# Patient Record
Sex: Female | Born: 1965 | Race: Black or African American | Hispanic: No | Marital: Single | Smoking: Never smoker
Health system: Southern US, Community
[De-identification: ages and names within clinical notes are randomized; demographics above are authoritative.]

## PROBLEM LIST (undated history)

## (undated) DIAGNOSIS — I10 Essential (primary) hypertension: Secondary | ICD-10-CM

## (undated) HISTORY — PX: ABDOMINAL HYSTERECTOMY: SHX81

---

## 2012-07-07 ENCOUNTER — Other Ambulatory Visit: Payer: Self-pay | Admitting: Obstetrics and Gynecology

## 2012-07-07 DIAGNOSIS — N920 Excessive and frequent menstruation with regular cycle: Secondary | ICD-10-CM

## 2012-07-07 DIAGNOSIS — D259 Leiomyoma of uterus, unspecified: Secondary | ICD-10-CM

## 2012-07-07 DIAGNOSIS — D500 Iron deficiency anemia secondary to blood loss (chronic): Secondary | ICD-10-CM

## 2012-07-09 ENCOUNTER — Other Ambulatory Visit: Payer: Self-pay | Admitting: Obstetrics and Gynecology

## 2012-07-09 DIAGNOSIS — D500 Iron deficiency anemia secondary to blood loss (chronic): Secondary | ICD-10-CM

## 2012-07-09 DIAGNOSIS — N920 Excessive and frequent menstruation with regular cycle: Secondary | ICD-10-CM

## 2012-07-09 DIAGNOSIS — D259 Leiomyoma of uterus, unspecified: Secondary | ICD-10-CM

## 2012-07-16 ENCOUNTER — Ambulatory Visit
Admission: RE | Admit: 2012-07-16 | Discharge: 2012-07-16 | Disposition: A | Discharge status: Home / Self Care | Payer: BC Managed Care – PPO | Source: Ambulatory Visit | Attending: Obstetrics and Gynecology | Admitting: Obstetrics and Gynecology

## 2012-07-16 ENCOUNTER — Telehealth: Payer: Self-pay | Admitting: Emergency Medicine

## 2012-07-16 ENCOUNTER — Other Ambulatory Visit: Payer: Self-pay | Admitting: Emergency Medicine

## 2012-07-16 DIAGNOSIS — I10 Essential (primary) hypertension: Secondary | ICD-10-CM

## 2012-07-16 DIAGNOSIS — D500 Iron deficiency anemia secondary to blood loss (chronic): Secondary | ICD-10-CM

## 2012-07-16 DIAGNOSIS — D259 Leiomyoma of uterus, unspecified: Secondary | ICD-10-CM

## 2012-07-16 DIAGNOSIS — N920 Excessive and frequent menstruation with regular cycle: Secondary | ICD-10-CM

## 2012-07-16 LAB — BUN: BUN: 7 mg/dL (ref 6–23)

## 2012-07-16 NOTE — Telephone Encounter (Signed)
LMOVM FOR JOY AT DR Donzetta Matters OFFICE TO SET PT UP FOR EBX AND THAT PT MRI PELVIS IS SCHEDULED FOR 07-22-12 AT 700AM AND TO CK ON PRECERT

## 2012-07-22 ENCOUNTER — Ambulatory Visit
Admission: RE | Admit: 2012-07-22 | Discharge: 2012-07-22 | Disposition: A | Discharge status: Home / Self Care | Payer: BC Managed Care – PPO | Source: Ambulatory Visit | Attending: Obstetrics and Gynecology | Admitting: Obstetrics and Gynecology

## 2012-07-22 DIAGNOSIS — D259 Leiomyoma of uterus, unspecified: Secondary | ICD-10-CM

## 2012-07-22 DIAGNOSIS — N920 Excessive and frequent menstruation with regular cycle: Secondary | ICD-10-CM

## 2012-07-22 DIAGNOSIS — D500 Iron deficiency anemia secondary to blood loss (chronic): Secondary | ICD-10-CM

## 2012-07-22 MED ORDER — GADOBENATE DIMEGLUMINE 529 MG/ML IV SOLN
20.0000 mL | Freq: Once | INTRAVENOUS | Status: AC | PRN
  Administered 2012-07-22: 20 mL via INTRAVENOUS

## 2012-07-23 ENCOUNTER — Telehealth: Payer: Self-pay | Admitting: Interventional Radiology

## 2012-09-08 ENCOUNTER — Telehealth: Payer: Self-pay | Admitting: Emergency Medicine

## 2012-09-08 NOTE — Telephone Encounter (Signed)
Message copied by Jari Sportsman on Tue Sep 08, 2012  2:19 PM ------      Message from: [place], [place]      Created: Wed Aug 19, 2012  9:24 AM      Regarding: FW: EBX RESULTS       COLPOSCOPY- R/S FOR 09-03-12 PER JOY W/ OFFICE            ----- Message -----         From: Jari Sportsman, EMT         Sent: 07/23/2012   5:22 PM           To: Jari Sportsman, EMT, Henri Ann Maki, RN      Subject: EBX RESULTS                                              PT IS HAVING ANOTHER EBX ON 08-19-12- GET RESULTS AND THEN PROCEED WITH [place] IF NEG.       ------

## 2012-09-08 NOTE — Telephone Encounter (Signed)
LM FOR JOY TO CK ON PATH RESULTS   JOY WILL FAX TO [place] WHEN SHE RECEIVES THEM.

## 2012-09-14 ENCOUNTER — Telehealth: Payer: Self-pay | Admitting: Interventional Radiology

## 2012-12-09 ENCOUNTER — Telehealth: Payer: Self-pay | Admitting: Emergency Medicine

## 2012-12-09 NOTE — Telephone Encounter (Signed)
SEE BELOW.

## 2012-12-09 NOTE — Telephone Encounter (Signed)
Message copied by Jari Sportsman on Wed Dec 09, 2012 11:42 AM ------      Message from: Simonne Come      Created: Mon Sep 14, 2012  3:01 PM      Regarding: RE: EBX RESULTS       Left a message with the patient to call clinic -> hopefully you can get in touch with me when she calls, otherwise if you could get a number where she can be reached.            I wanted to ask her if she'd just like to proceed with the UFE versus see me again in clinic to review her images -> I'd prefer to see her in clinic again as her many issue is adenomyosis and NOT fibroid, but I'd still be willing to try the UFE if she completely opposed to a hysterectomy.            Thanks,      Vonna Kotyk      ----- Message -----         From: Jari Sportsman, EMT         Sent: 09/14/2012   9:12 AM           To: Simonne Come, MD      Subject: RE: EBX RESULTS                                          OK JUST LET ME KNOW!            THANKS,      T      ----- Message -----         From: Simonne Come, MD         Sent: 09/11/2012   4:41 PM           To: Jari Sportsman, EMT      Subject: RE: EBX RESULTS                                          I will need to call patient again and see if she wants to proceed or see me in clinic again to review images.            Thanks,      Vonna Kotyk      ----- Message -----         From: Jari Sportsman, EMT         Sent: 09/11/2012  10:48 AM           To: Simonne Come, MD      Subject: FW: EBX RESULTS                                          I RECEIVED THE ENDOCERVIX CURETTINGS BX RESULTS AND SCANNED IN EPIC-- PLEASE ADVISE ON [place].            TAMS            ----- Message -----         From: Jari Sportsman, EMT         Sent: 08/19/2012   9:24 AM  To: Jari Sportsman, EMT, Henri Ann Maki, RN      Subject: FW: EBX RESULTS                                          COLPOSCOPY- R/S FOR 09-03-12 PER JOY W/ OFFICE            ----- Message -----         From: Jari Sportsman, EMT         Sent: 07/23/2012    5:22 PM           To: Jari Sportsman, EMT, Henri Ann Maki, RN      Subject: EBX RESULTS                                              PT IS HAVING ANOTHER EBX ON 08-19-12- GET RESULTS AND THEN PROCEED WITH [place] IF NEG.                               ------

## 2014-11-16 ENCOUNTER — Emergency Department (HOSPITAL_COMMUNITY): Payer: 59

## 2014-11-16 ENCOUNTER — Encounter (HOSPITAL_COMMUNITY): Payer: Self-pay | Admitting: *Deleted

## 2014-11-16 ENCOUNTER — Emergency Department (HOSPITAL_COMMUNITY)
Admission: EM | Admit: 2014-11-16 | Discharge: 2014-11-16 | Disposition: A | Discharge status: Home / Self Care | Payer: 59 | Attending: Emergency Medicine | Admitting: Emergency Medicine

## 2014-11-16 DIAGNOSIS — I10 Essential (primary) hypertension: Secondary | ICD-10-CM | POA: Diagnosis not present

## 2014-11-16 DIAGNOSIS — R42 Dizziness and giddiness: Secondary | ICD-10-CM

## 2014-11-16 DIAGNOSIS — E860 Dehydration: Secondary | ICD-10-CM | POA: Insufficient documentation

## 2014-11-16 DIAGNOSIS — I9589 Other hypotension: Secondary | ICD-10-CM | POA: Diagnosis not present

## 2014-11-16 HISTORY — DX: Essential (primary) hypertension: I10

## 2014-11-16 LAB — CBC WITH DIFFERENTIAL/PLATELET
BASOS PCT: 0 %
Basophils Absolute: 0 10*3/uL (ref 0.0–0.1)
Eosinophils Absolute: 0.3 10*3/uL (ref 0.0–0.7)
Eosinophils Relative: 3 %
HEMATOCRIT: 34.6 % — AB (ref 36.0–46.0)
HEMOGLOBIN: 11 g/dL — AB (ref 12.0–15.0)
LYMPHS ABS: 3.2 10*3/uL (ref 0.7–4.0)
Lymphocytes Relative: 34 %
MCH: 27.6 pg (ref 26.0–34.0)
MCHC: 31.8 g/dL (ref 30.0–36.0)
MCV: 86.7 fL (ref 78.0–100.0)
MONOS PCT: 6 %
Monocytes Absolute: 0.6 10*3/uL (ref 0.1–1.0)
NEUTROS ABS: 5.3 10*3/uL (ref 1.7–7.7)
NEUTROS PCT: 57 %
Platelets: 262 10*3/uL (ref 150–400)
RBC: 3.99 MIL/uL (ref 3.87–5.11)
RDW: 14 % (ref 11.5–15.5)
WBC: 9.3 10*3/uL (ref 4.0–10.5)

## 2014-11-16 LAB — BASIC METABOLIC PANEL
Anion gap: 8 (ref 5–15)
BUN: 17 mg/dL (ref 6–20)
CHLORIDE: 103 mmol/L (ref 101–111)
CO2: 25 mmol/L (ref 22–32)
CREATININE: 1.3 mg/dL — AB (ref 0.44–1.00)
Calcium: 9.1 mg/dL (ref 8.9–10.3)
GFR calc non Af Amer: 47 mL/min — ABNORMAL LOW (ref >60)
GFR, EST AFRICAN AMERICAN: 54 mL/min — AB (ref >60)
Glucose, Bld: 189 mg/dL — ABNORMAL HIGH (ref 65–99)
POTASSIUM: 3.3 mmol/L — AB (ref 3.5–5.1)
Sodium: 136 mmol/L (ref 135–145)

## 2014-11-16 LAB — URINALYSIS, ROUTINE W REFLEX MICROSCOPIC
BILIRUBIN URINE: NEGATIVE
Glucose, UA: NEGATIVE mg/dL
Hgb urine dipstick: NEGATIVE
LEUKOCYTES UA: NEGATIVE
NITRITE: NEGATIVE
PH: 6.5 (ref 5.0–8.0)
PROTEIN: NEGATIVE mg/dL
Specific Gravity, Urine: 1.02 (ref 1.005–1.030)
UROBILINOGEN UA: 4 mg/dL — AB (ref 0.0–1.0)

## 2014-11-16 LAB — ETHANOL

## 2014-11-16 LAB — D-DIMER, QUANTITATIVE (NOT AT ARMC)

## 2014-11-16 LAB — TROPONIN I

## 2014-11-16 MED ORDER — SODIUM CHLORIDE 0.9 % IV BOLUS (SEPSIS)
1000.0000 mL | Freq: Once | INTRAVENOUS | Status: AC
  Administered 2014-11-16: 1000 mL via INTRAVENOUS

## 2014-11-16 NOTE — ED Notes (Signed)
Pt refused to do orthostatic vitals.

## 2014-11-16 NOTE — ED Notes (Signed)
Pt c/o dizziness and headache that started x 45 minutes ago; pt is c/o left sided chest pain

## 2014-11-16 NOTE — Discharge Instructions (Signed)
You were seen today for dizziness. Your dizziness is likely a combination of low blood pressure and dehydration. Given that you're just started on a blood pressure medication in the last several weeks, that may be causing your blood pressure to be too low. You also have evidence of dehydration which can worsen symptoms of low blood pressure including dizziness. You should stop taking her blood pressure medications and follow-up in one day for blood pressure recheck with her primary physician.  Dizziness Dizziness is a common problem. It is a feeling of unsteadiness or light-headedness. You may feel like you are about to faint. Dizziness can lead to injury if you stumble or fall. A person of any age group can suffer from dizziness, but dizziness is more common in older adults. CAUSES  Dizziness can be caused by many different things, including:  Middle ear problems.  Standing for too long.  Infections.  An allergic reaction.  Aging.  An emotional response to something, such as the sight of blood.  Side effects of medicines.  Tiredness.  Problems with circulation or blood pressure.  Excessive use of alcohol or medicines, or illegal drug use.  Breathing too fast (hyperventilation).  An irregular heart rhythm (arrhythmia).  A low red blood cell count (anemia).  Pregnancy.  Vomiting, diarrhea, fever, or other illnesses that cause body fluid loss (dehydration).  Diseases or conditions such as Parkinson's disease, high blood pressure (hypertension), diabetes, and thyroid problems.  Exposure to extreme heat. DIAGNOSIS  Your health care provider will ask about your symptoms, perform a physical exam, and perform an electrocardiogram (ECG) to record the electrical activity of your heart. Your health care provider may also perform other heart or blood tests to determine the cause of your dizziness. These may include:  Transthoracic echocardiogram (TTE). During echocardiography, sound  waves are used to evaluate how blood flows through your heart.  Transesophageal echocardiogram (TEE).  Cardiac monitoring. This allows your health care provider to monitor your heart rate and rhythm in real time.  Holter monitor. This is a portable device that records your heartbeat and can help diagnose heart arrhythmias. It allows your health care provider to track your heart activity for several days if needed.  Stress tests by exercise or by giving medicine that makes the heart beat faster. TREATMENT  Treatment of dizziness depends on the cause of your symptoms and can vary greatly. HOME CARE INSTRUCTIONS   Drink enough fluids to keep your urine clear or pale yellow. This is especially important in very hot weather. In older adults, it is also important in cold weather.  Take your medicine exactly as directed if your dizziness is caused by medicines. When taking blood pressure medicines, it is especially important to get up slowly.  Rise slowly from chairs and steady yourself until you feel okay.  In the morning, first sit up on the side of the bed. When you feel okay, stand slowly while holding onto something until you know your balance is fine.  Move your legs often if you need to stand in one place for a long time. Tighten and relax your muscles in your legs while standing.  Have someone stay with you for 1-2 days if dizziness continues to be a problem. Do this until you feel you are well enough to stay alone. Have the person call your health care provider if he or she notices changes in you that are concerning.  Do not drive or use heavy machinery if you feel dizzy.  Do not drink alcohol. SEEK IMMEDIATE MEDICAL CARE IF:   Your dizziness or light-headedness gets worse.  You feel nauseous or vomit.  You have problems talking, walking, or using your arms, hands, or legs.  You feel weak.  You are not thinking clearly or you have trouble forming sentences. It may take a  friend or family member to notice this.  You have chest pain, abdominal pain, shortness of breath, or sweating.  Your vision changes.  You notice any bleeding.  You have side effects from medicine that seems to be getting worse rather than better. MAKE SURE YOU:   Understand these instructions.  Will watch your condition.  Will get help right away if you are not doing well or get worse. Document Released: 08/07/2000 Document Revised: 02/16/2013 Document Reviewed: 08/31/2010 [facility] Patient Information 2015 [place], [place]. This information is not intended to replace advice given to you by your health care provider. Make sure you discuss any questions you have with your health care provider.  Dehydration, Adult Dehydration is when you lose more fluids from the body than you take in. Vital organs like the kidneys, brain, and heart cannot function without a proper amount of fluids and salt. Any loss of fluids from the body can cause dehydration.  CAUSES   Vomiting.  Diarrhea.  Excessive sweating.  Excessive urine output.  Fever. SYMPTOMS  Mild dehydration  Thirst.  Dry lips.  Slightly dry mouth. Moderate dehydration  Very dry mouth.  Sunken eyes.  Skin does not bounce back quickly when lightly pinched and released.  Dark urine and decreased urine production.  Decreased tear production.  Headache. Severe dehydration  Very dry mouth.  Extreme thirst.  Rapid, weak pulse (more than 100 beats per minute at rest).  Cold hands and feet.  Not able to sweat in spite of heat and temperature.  Rapid breathing.  Blue lips.  Confusion and lethargy.  Difficulty being awakened.  Minimal urine production.  No tears. DIAGNOSIS  Your caregiver will diagnose dehydration based on your symptoms and your exam. Blood and urine tests will help confirm the diagnosis. The diagnostic evaluation should also identify the cause of dehydration. TREATMENT  Treatment of  mild or moderate dehydration can often be done at home by increasing the amount of fluids that you drink. It is best to drink small amounts of fluid more often. Drinking too much at one time can make vomiting worse. Refer to the home care instructions below. Severe dehydration needs to be treated at the hospital where you will probably be given intravenous (IV) fluids that contain water and electrolytes. HOME CARE INSTRUCTIONS   Ask your caregiver about specific rehydration instructions.  Drink enough fluids to keep your urine clear or pale yellow.  Drink small amounts frequently if you have nausea and vomiting.  Eat as you normally do.  Avoid:  Foods or drinks high in sugar.  Carbonated drinks.  Juice.  Extremely hot or cold fluids.  Drinks with caffeine.  Fatty, greasy foods.  Alcohol.  Tobacco.  Overeating.  Gelatin desserts.  Wash your hands well to avoid spreading bacteria and viruses.  Only take over-the-counter or prescription medicines for pain, discomfort, or fever as directed by your caregiver.  Ask your caregiver if you should continue all prescribed and over-the-counter medicines.  Keep all follow-up appointments with your caregiver. SEEK MEDICAL CARE IF:  You have abdominal pain and it increases or stays in one area (localizes).  You have a rash, stiff neck, or  severe headache.  You are irritable, sleepy, or difficult to awaken.  You are weak, dizzy, or extremely thirsty. SEEK IMMEDIATE MEDICAL CARE IF:   You are unable to keep fluids down or you get worse despite treatment.  You have frequent episodes of vomiting or diarrhea.  You have blood or green matter (bile) in your vomit.  You have blood in your stool or your stool looks black and tarry.  You have not urinated in 6 to 8 hours, or you have only urinated a small amount of very dark urine.  You have a fever.  You faint. MAKE SURE YOU:   Understand these instructions.  Will watch  your condition.  Will get help right away if you are not doing well or get worse. Document Released: 02/11/2005 Document Revised: 05/06/2011 Document Reviewed: 10/01/2010 [facility] Patient Information 2015 [place], [place]. This information is not intended to replace advice given to you by your health care provider. Make sure you discuss any questions you have with your health care provider.

## 2014-11-16 NOTE — ED Provider Notes (Signed)
CSN: [id number]     Arrival date & time 11/16/14  0014 History   This chart was scribed for Merryl Hacker, MD by Randa Evens, ED Scribe. This patient was seen in room APA09/APA09 and the patient's care was started at 12:23 AM.      Chief Complaint  Patient presents with  . Dizziness   The history is provided by the patient. No language interpreter was used.   HPI Comments: Taylor Franklin is a 49 y.o. female who presents to the Emergency Department complaining of dizziness onset PTA. PT reports some weakness, HA and resolved chest tightness. Pt states that her symptoms began soon after waking from a short nap. She states that her symptoms are worse when trying to stand. Family member states that she may have lost consciousness. Daughter states that she hears a "thud" and when she went to check on her she was leaning on the wall trying not to fall over.  multiple attempts to ambulate the patient at home failed secondary to dizziness. Patient describes feeling off balance and not room spinning. Patient also reports headache. Denies worst headache of her life. States she feels generally weak but no focal weakness or numbness. No vision changes. Denies fever, cough, congestion, n/v/d.    Patient was recently started on blood pressure medication one month ago. This was changed to an arm with HCTZ secondary to intolerance to an ACE inhibitor. This change occurred approximately 2 weeks ago.   Past Medical History  Diagnosis Date  . Hypertension    Past Surgical History  Procedure Laterality Date  . Abdominal hysterectomy    . Cesarean section      x3   History reviewed. No pertinent family history. Social History  Substance Use Topics  . Smoking status: Never Smoker   . Smokeless tobacco: Never Used  . Alcohol Use: No   OB History    No data available     Review of Systems  Constitutional: Negative for fever.  Eyes: Negative for visual disturbance.  Respiratory: Negative for  cough, chest tightness and shortness of breath.   Cardiovascular: Positive for chest pain. Negative for leg swelling.  Gastrointestinal: Negative for nausea, vomiting and abdominal pain.  Genitourinary: Negative for dysuria.  Musculoskeletal: Negative for back pain.  Neurological: Positive for dizziness, weakness, light-headedness and headaches.  Psychiatric/Behavioral: Negative for confusion.  All other systems reviewed and are negative.     Allergies  Lisinopril  Home Medications   Prior to Admission medications   Medication Sig Start Date End Date Taking? Authorizing Provider  tiZANidine (ZANAFLEX) 4 MG tablet Take 4 mg by mouth every 6 (six) hours as needed for muscle spasms.   Yes Historical Provider, MD   BP 93/64 mmHg  Pulse 57  Temp(Src) 98 F (36.7 C) (Oral)  Resp 10  Ht 5\' 6"  (1.676 m)  Wt 188 lb (85.276 kg)  BMI 30.36 kg/m2  SpO2 100%   Physical Exam  Constitutional: She is oriented to person, place, and time. No distress.  HENT:  Head: Normocephalic and atraumatic.  Eyes: EOM are normal. Pupils are equal, round, and reactive to light.  Neck: Neck supple.  Cardiovascular: Normal rate, regular rhythm and normal heart sounds.   No murmur heard. Pulmonary/Chest: Effort normal and breath sounds normal. No respiratory distress. She has no wheezes.  Abdominal: Soft. Bowel sounds are normal. There is no tenderness. There is no rebound and no guarding.  Neurological: She is alert and oriented to person, place,  and time.  Patient very slow with following commands, no facial asymmetry, cranial nerves II through XII intact, patient is alert and oriented, 5 out of 5 strength bilateral upper and lower extremities, no dysmetria to finger-nose-finger  Skin: Skin is warm and dry.  Psychiatric: She has a normal mood and affect.  Nursing note and vitals reviewed.   ED Course  Procedures (including critical care time) DIAGNOSTIC STUDIES:   COORDINATION OF CARE: 12:42  AM-Discussed treatment plan with pt at bedside and pt agreed to plan.     Labs Review Labs Reviewed  CBC WITH DIFFERENTIAL/PLATELET - Abnormal; Notable for the following:    Hemoglobin 11.0 (*)    HCT 34.6 (*)    All other components within normal limits  BASIC METABOLIC PANEL - Abnormal; Notable for the following:    Potassium 3.3 (*)    Glucose, Bld 189 (*)    Creatinine, Ser 1.30 (*)    GFR calc non Af Amer 47 (*)    GFR calc Af Amer 54 (*)    All other components within normal limits  URINALYSIS, ROUTINE W REFLEX MICROSCOPIC (NOT AT [facility]) - Abnormal; Notable for the following:    Ketones, ur TRACE (*)    Urobilinogen, UA 4.0 (*)    All other components within normal limits  TROPONIN I  D-DIMER, QUANTITATIVE (NOT AT [facility])  ETHANOL    Imaging Review Ct Head Wo Contrast  11/16/2014   CLINICAL DATA:  49 year old female with chest pain and weakness  EXAM: CT HEAD WITHOUT CONTRAST  TECHNIQUE: Contiguous axial images were obtained from the base of the skull through the vertex without intravenous contrast.  COMPARISON:  None.  FINDINGS: The ventricles and the sulci are appropriate in size for the patient's age. There is no intracranial hemorrhage. No midline shift or mass effect identified. The gray-white matter differentiation is preserved.  There is mild mucoperiosteal thickening of paranasal sinuses with partial opacification of the ethmoid air cells. The visualized mastoid air cells are well aerated. The calvarium is intact.  IMPRESSION: No acute intracranial pathology.   Electronically Signed   By: Anner Crete M.D.   On: 11/16/2014 01:56   Dg Chest Portable 1 View  11/16/2014   CLINICAL DATA:  Central chest pain and dizziness tonight, history hypertension  EXAM: PORTABLE CHEST ONE VIEW:  COMPARISON:  Portable exam 0050 hr compared to 03/11/2010  FINDINGS: Normal heart size, mediastinal contours, and pulmonary vascularity.  Lungs clear.  No pleural effusion pneumothorax.  Bones  unremarkable.  IMPRESSION: No acute abnormalities.   Electronically Signed   By: Lavonia Dana M.D.   On: 11/16/2014 01:44      EKG Interpretation   Date/Time:  Wednesday November 16 2014 00:27:58 EDT Ventricular Rate:  78 PR Interval:  135 QRS Duration: 79 QT Interval:  401 QTC Calculation: 457 R Axis:   44 Text Interpretation:  Sinus rhythm Confirmed by HORTON  MD, COURTNEY  ([id number]) on 11/16/2014 1:34:08 AM      MDM   Final diagnoses:  Dizziness  Dehydration  Other specified hypotension    Patient presents with dizziness and near syncope. Nonfocal on exam. Vital signs notable for initial blood pressure of 90/54. Patient has recently started blood pressure medications. She describes dizziness with position and upon standing. Patient given fluids. She has a nonfocal exam. No signs of dysmetria.  Workup notable for creatinine of 1.3. Unknown baseline. Patient may be mildly dehydrated. She does have trace ketones in her urine. D-dimer  is negative troponin is reassuring. EKG is reassuring.  CT head obtained given headache and near syncope and is reassuring. After 2 L of fluid, patient ambulated and reports that she feels much better. She does continue to endorse some dizziness. Blood pressures have been consistently 90s over 50s. Third liter of fluids ordered.  4:10 AM On recheck, patient reports improvement of symptoms. She states she is ready to go home. Blood pressure now 93/64. Feel patient symptoms may be related to dehydration in addition to new onset of blood pressure medications. She was advised to discontinue her blood-pressure medication and follow-up in the next day with her primary doctor for recheck. She was able to ambulate without difficulty.  After history, exam, and medical workup I feel the patient has been appropriately medically screened and is safe for discharge home. Pertinent diagnoses were discussed with the patient. Patient was given return precautions.  I  personally performed the services described in this documentation, which was scribed in my presence. The recorded information has been reviewed and is accurate.      Merryl Hacker, MD 11/16/14 [phone]

## 2016-01-15 NOTE — Telephone Encounter (Signed)
Wrong encounter. 

## 2016-02-24 IMAGING — CR DG CHEST 1V PORT
1 series · 1 of 1 positions shown · non-contrast
Comparison: Portable exam 2212 hr compared to 03/11/2010

CLINICAL DATA: Central chest pain and dizziness tonight, history
hypertension

EXAM:
PORTABLE CHEST ONE VIEW:

[ap]
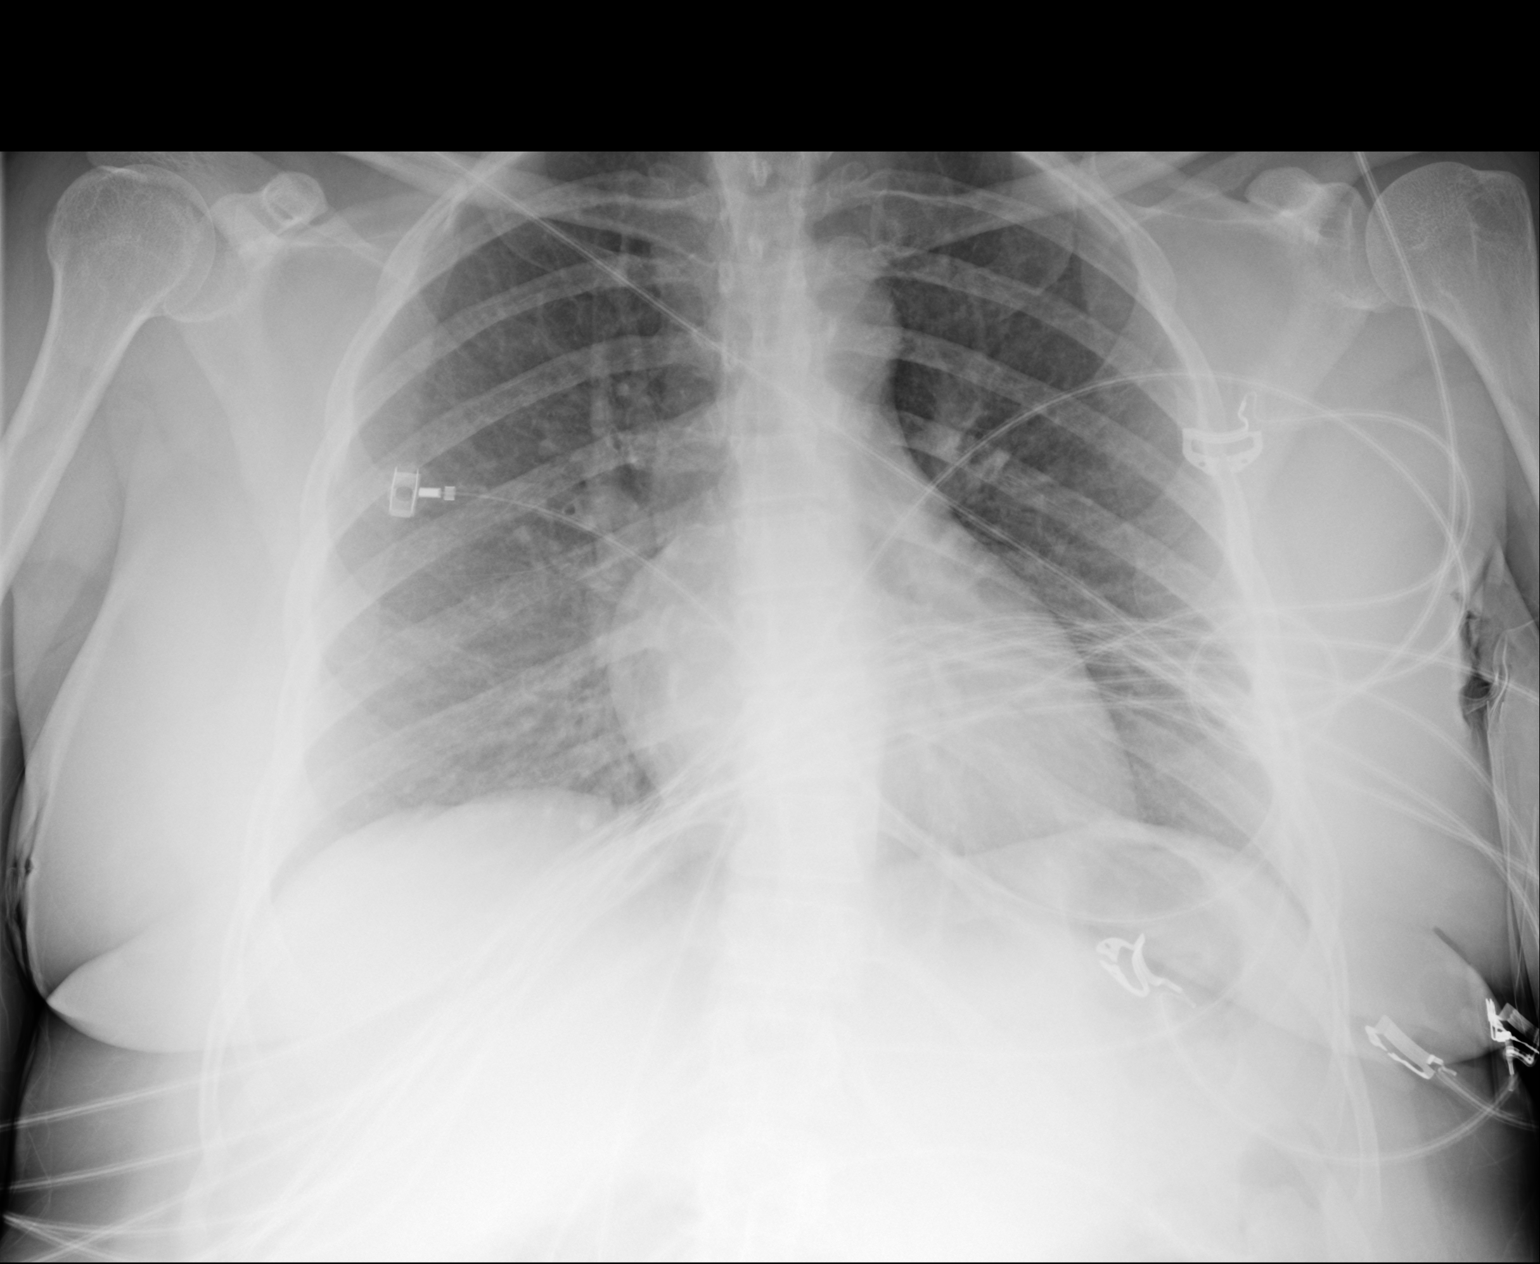

[1 of 1 positions shown; findings below may reference images not displayed]

FINDINGS: Normal heart size, mediastinal contours, and pulmonary vascularity.

Lungs clear.

No pleural effusion pneumothorax.

Bones unremarkable.
IMPRESSION: No acute abnormalities.

## 2016-02-24 IMAGING — CT CT HEAD W/O CM
1 of 2 series · 16 of 30 positions shown, 20 images · non-contrast
Comparison: None.

CLINICAL DATA: 49-year-old female with chest pain and weakness

EXAM:
CT HEAD WITHOUT CONTRAST
TECHNIQUE: Contiguous axial images were obtained from the base of the skull
through the vertex without intravenous contrast.

[Series 3: headtrauma 2.4 h60s · axial · 0.43mm/px · z∈[+52,+176]mm · 16 of 60 slices shown, 20 images]
[im 4/60  brain]
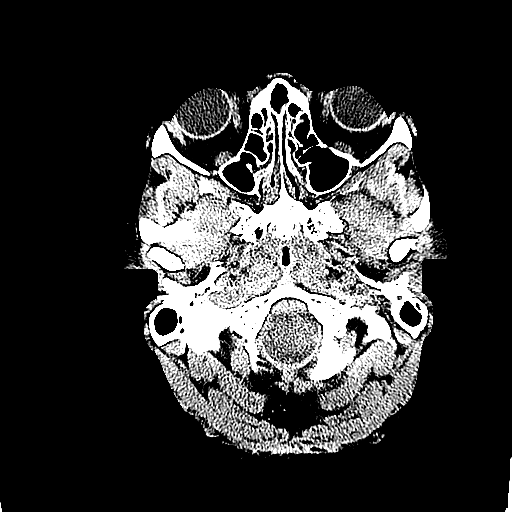
[im 4/60  bone]
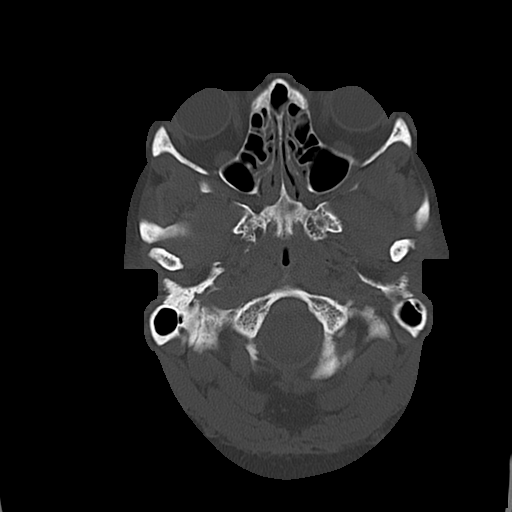
[im 7/60  brain]
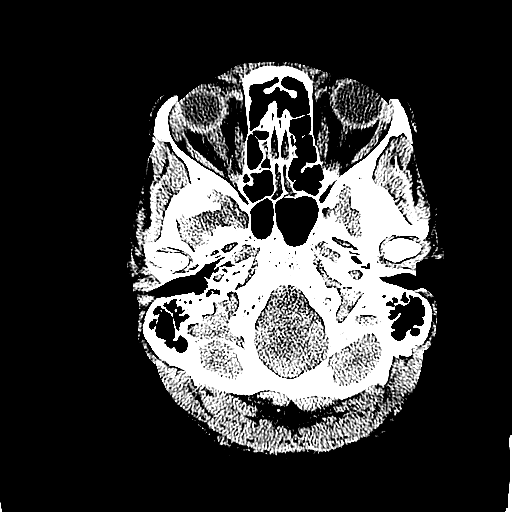
[im 10/60  brain]
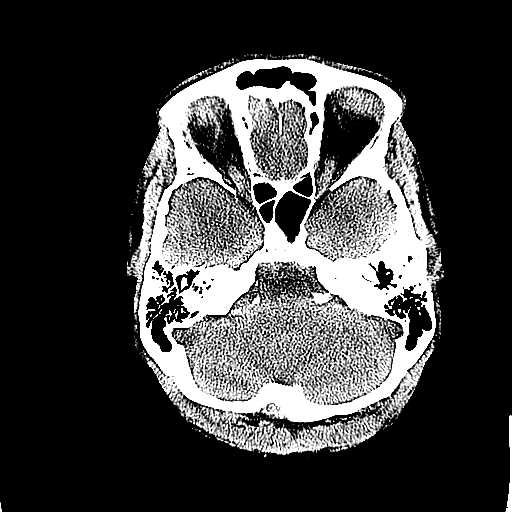
[im 13/60  brain]
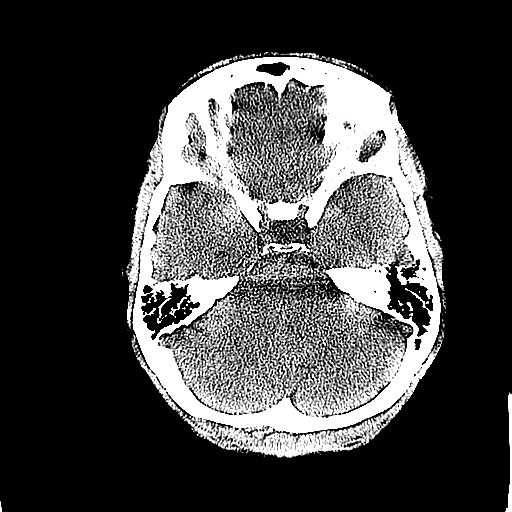
[im 19/60  brain]
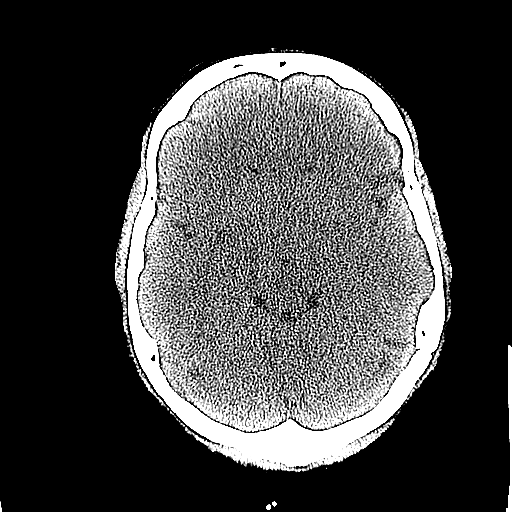
[im 19/60  bone]
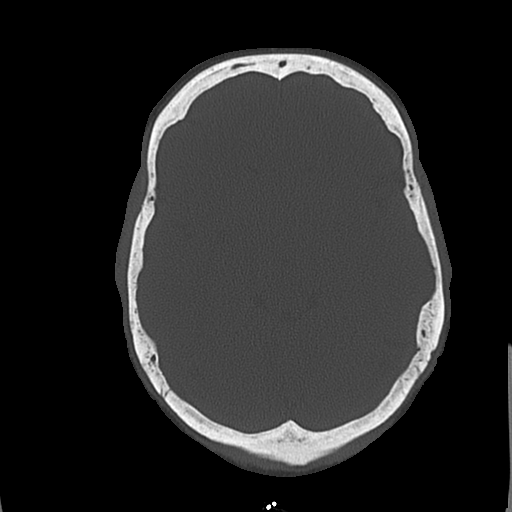
[im 22/60  brain]
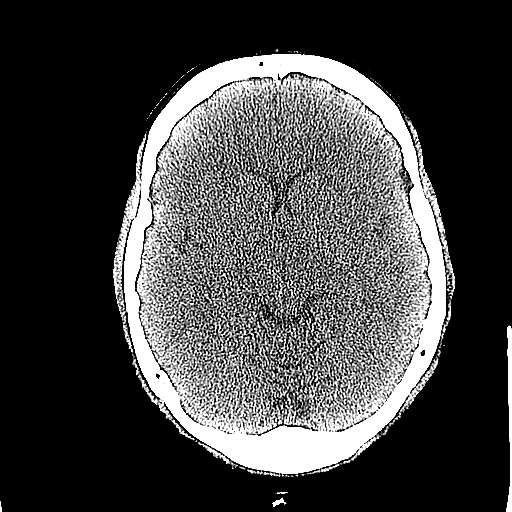
[im 25/60  brain]
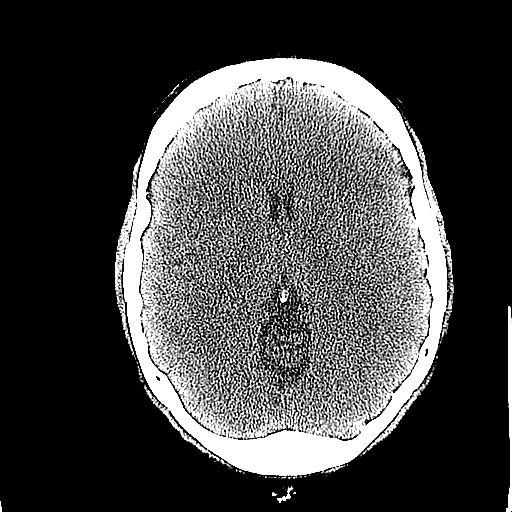
[im 28/60  brain]
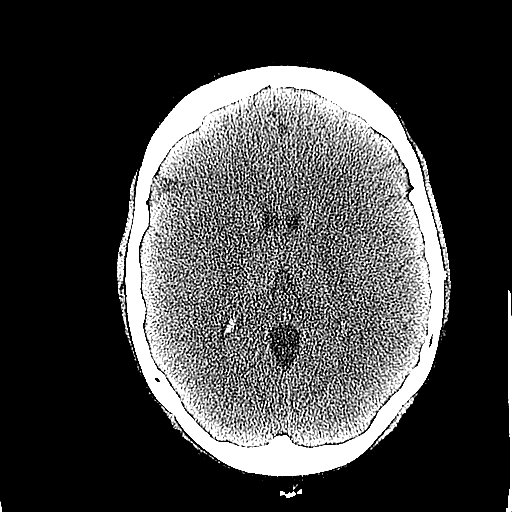
[im 32/60  brain]
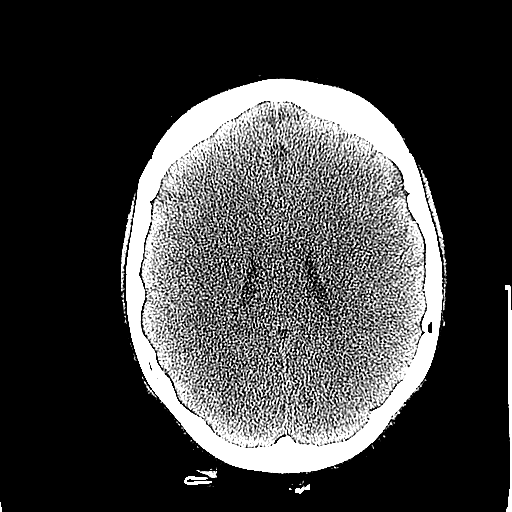
[im 32/60  bone]
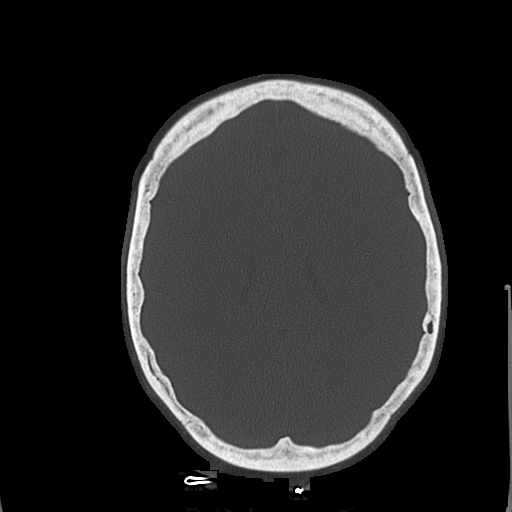
[im 35/60  brain]
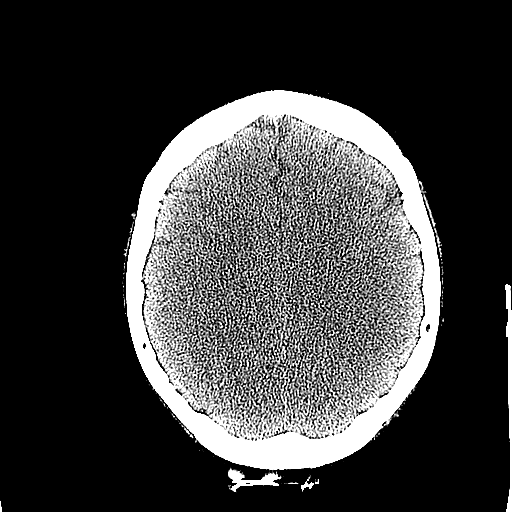
[im 38/60  brain]
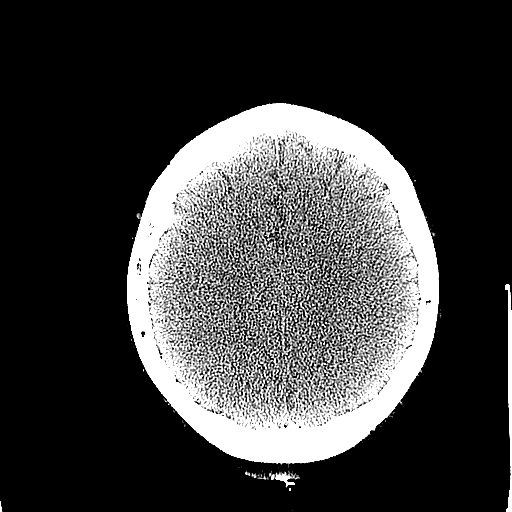
[im 41/60  brain]
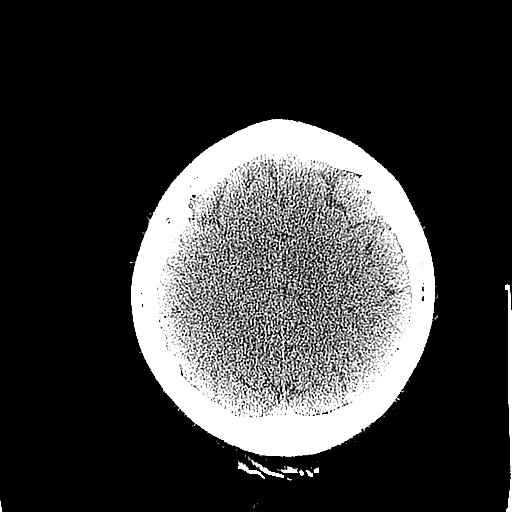
[im 47/60  brain]
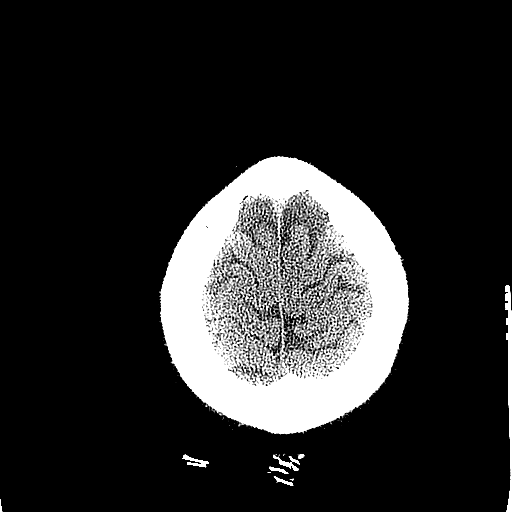
[im 47/60  bone]
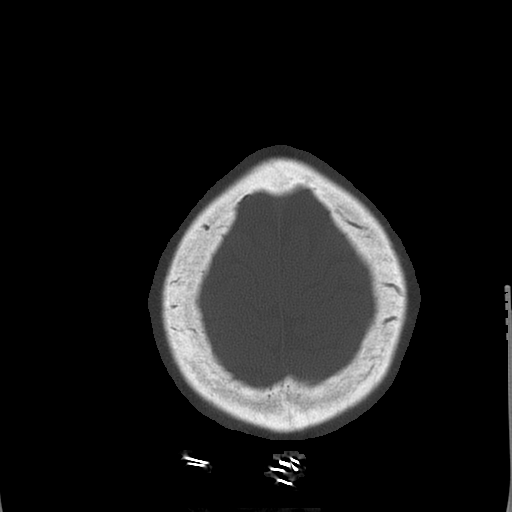
[im 50/60  brain]
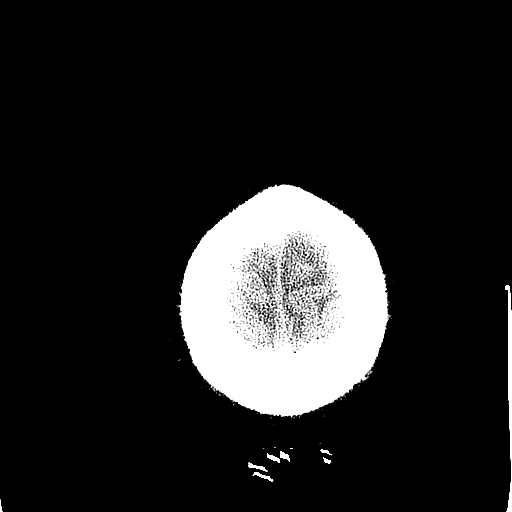
[im 53/60  brain]
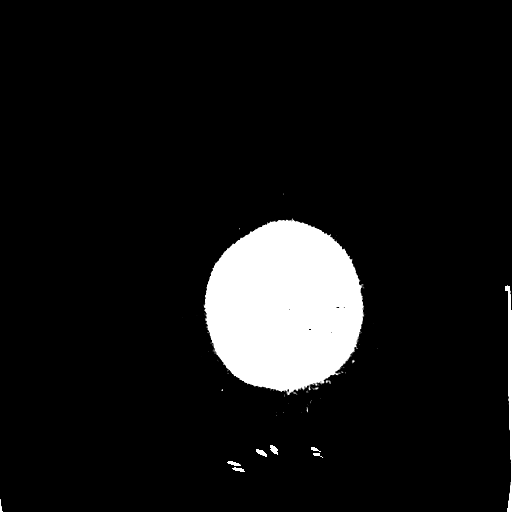
[im 56/60  brain]
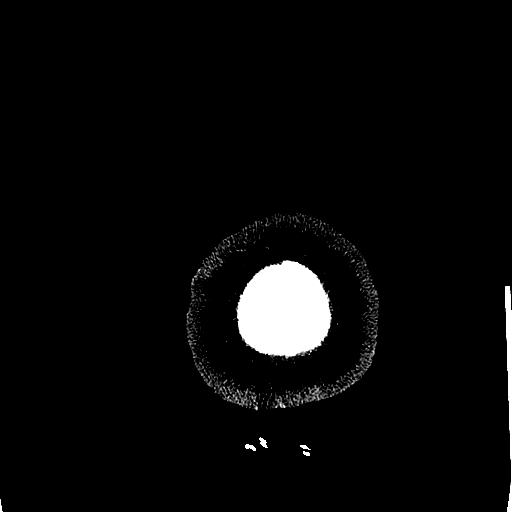

[16 of 30 positions shown; findings below may reference images not displayed]

FINDINGS: The ventricles and the sulci are appropriate in size for the
patient's age. There is no intracranial hemorrhage. No midline shift
or mass effect identified. The gray-white matter differentiation is
preserved.

There is mild mucoperiosteal thickening of paranasal sinuses with
partial opacification of the ethmoid air cells. The visualized
mastoid air cells are well aerated. The calvarium is intact.
IMPRESSION: No acute intracranial pathology.

## 2019-09-19 ENCOUNTER — Ambulatory Visit
Admission: EM | Admit: 2019-09-19 | Discharge: 2019-09-19 | Disposition: A | Discharge status: Home / Self Care | Payer: Medicaid - Out of State | Attending: Emergency Medicine | Admitting: Emergency Medicine

## 2019-09-19 ENCOUNTER — Other Ambulatory Visit: Payer: Self-pay

## 2019-09-19 DIAGNOSIS — L02412 Cutaneous abscess of left axilla: Secondary | ICD-10-CM | POA: Diagnosis not present

## 2019-09-19 MED ORDER — DOXYCYCLINE HYCLATE 100 MG PO CAPS: 100.0000 mg | ORAL_CAPSULE | Freq: Two times a day (BID) | ORAL | 0 refills | Status: DC

## 2019-09-19 NOTE — ED Provider Notes (Signed)
[place]   [id number] 09/19/19 Arrival Time: 21   CB:[id number]  SUBJECTIVE:  Taylor Franklin is a 54 y.o. female who presents with a possible abscess of her LT axilla x 1 week.  Denies precipitating event or trauma.  Denies alleviating factors.  Tender to the touch.  Reports previous symptoms in the past.  Denies fever, chills, nausea, vomiting.    ROS: As per HPI.  All other pertinent ROS negative.     Past Medical History:  Diagnosis Date  . Hypertension    Past Surgical History:  Procedure Laterality Date  . ABDOMINAL HYSTERECTOMY    . CESAREAN SECTION     x3   Allergies  Allergen Reactions  . Lisinopril Anaphylaxis   No current facility-administered medications on file prior to encounter.   Current Outpatient Medications on File Prior to Encounter  Medication Sig Dispense Refill  . tiZANidine (ZANAFLEX) 4 MG tablet Take 4 mg by mouth every 6 (six) hours as needed for muscle spasms.     Social History   Socioeconomic History  . Marital status: Single    Spouse name: Not on file  . Number of children: Not on file  . Years of education: Not on file  . Highest education level: Not on file  Occupational History  . Not on file  Tobacco Use  . Smoking status: Never Smoker  . Smokeless tobacco: Never Used  Substance and Sexual Activity  . Alcohol use: No  . Drug use: No  . Sexual activity: Yes    Birth control/protection: Surgical  Other Topics Concern  . Not on file  Social History Narrative  . Not on file   Social Determinants of Health   Financial Resource Strain:   . Difficulty of Paying Living Expenses:   Food Insecurity:   . Worried About Charity fundraiser in the Last Year:   . Arboriculturist in the Last Year:   Transportation Needs:   . Film/video editor (Medical):   Marland Kitchen Lack of Transportation (Non-Medical):   Physical Activity:   . Days of Exercise per Week:   . Minutes of Exercise per Session:   Stress:   . Feeling of  Stress :   Social Connections:   . Frequency of Communication with Friends and Family:   . Frequency of Social Gatherings with Friends and Family:   . Attends Religious Services:   . Active Member of Clubs or Organizations:   . Attends Archivist Meetings:   Marland Kitchen Marital Status:   Intimate Partner Violence:   . Fear of Current or Ex-Partner:   . Emotionally Abused:   Marland Kitchen Physically Abused:   . Sexually Abused:    Family History  Family history unknown: Yes    OBJECTIVE:  Vitals:   09/19/19 1144  BP: (!) 134/81  Pulse: 71  Resp: 17  Temp: 97.7 F (36.5 C)  TempSrc: Oral  SpO2: 96%     General appearance: alert; no distress Skin: 3x4 cm induration of her LT axillar; tender to touch; no active drainage Psychological: alert and cooperative; normal mood and affect   ASSESSMENT & PLAN:  1. Abscess of left axilla     Meds ordered this encounter  Medications  . doxycycline (VIBRAMYCIN) 100 MG capsule    Sig: Take 1 capsule (100 mg total) by mouth 2 (two) times daily.    Dispense:  20 capsule    Refill:  0    Order Specific Question:  Supervising Provider    Answer:   Raylene Everts [[id number]]    Apply warm compresses 3-4x daily for 10-15 minutes Wash site daily with warm water and mild soap Keep covered to avoid friction Take antibiotic as prescribed and to completion Follow up here or with PCP if symptoms persists Return or go to the ED if you have any new or worsening symptoms redness, swelling, drainage, fever, chills, nausea, vomiting, etc...   Reviewed expectations re: course of current medical issues. Questions answered. Outlined signs and symptoms indicating need for more acute intervention. Patient verbalized understanding. After Visit Summary given.          Lestine Box, PA-C 09/19/19 1151

## 2019-09-19 NOTE — ED Triage Notes (Signed)
Pt presents with abscess under left arm X 1 week.

## 2019-09-19 NOTE — Discharge Instructions (Addendum)
Apply warm compresses 3-4x daily for 10-15 minutes Wash site daily with warm water and mild soap Keep covered to avoid friction Take antibiotic as prescribed and to completion Follow up here or with PCP if symptoms persists Return or go to the ED if you have any new or worsening symptoms redness, swelling, drainage, fever, chills, nausea, vomiting, etc..Marland Kitchen

## 2020-05-14 ENCOUNTER — Encounter: Payer: Self-pay | Admitting: Emergency Medicine

## 2020-05-14 ENCOUNTER — Emergency Department (HOSPITAL_COMMUNITY)
Admission: EM | Admit: 2020-05-14 | Discharge: 2020-05-14 | Disposition: A | Discharge status: Home / Self Care | Payer: Medicaid - Out of State | Attending: Emergency Medicine | Admitting: Emergency Medicine

## 2020-05-14 ENCOUNTER — Other Ambulatory Visit: Payer: Self-pay

## 2020-05-14 ENCOUNTER — Ambulatory Visit: Admission: EM | Admit: 2020-05-14 | Discharge: 2020-05-14 | Payer: Medicaid - Out of State

## 2020-05-14 ENCOUNTER — Encounter (HOSPITAL_COMMUNITY): Payer: Self-pay

## 2020-05-14 DIAGNOSIS — I10 Essential (primary) hypertension: Secondary | ICD-10-CM | POA: Insufficient documentation

## 2020-05-14 DIAGNOSIS — R2243 Localized swelling, mass and lump, lower limb, bilateral: Secondary | ICD-10-CM | POA: Diagnosis present

## 2020-05-14 DIAGNOSIS — R0602 Shortness of breath: Secondary | ICD-10-CM | POA: Insufficient documentation

## 2020-05-14 DIAGNOSIS — M25472 Effusion, left ankle: Secondary | ICD-10-CM | POA: Diagnosis not present

## 2020-05-14 DIAGNOSIS — M25474 Effusion, right foot: Secondary | ICD-10-CM | POA: Diagnosis not present

## 2020-05-14 DIAGNOSIS — M25471 Effusion, right ankle: Secondary | ICD-10-CM

## 2020-05-14 DIAGNOSIS — M25475 Effusion, left foot: Secondary | ICD-10-CM | POA: Diagnosis not present

## 2020-05-14 DIAGNOSIS — M7989 Other specified soft tissue disorders: Secondary | ICD-10-CM

## 2020-05-14 LAB — COMPREHENSIVE METABOLIC PANEL
ALT: 17 U/L (ref 0–44)
AST: 20 U/L (ref 15–41)
Albumin: 4 g/dL (ref 3.5–5.0)
Alkaline Phosphatase: 69 U/L (ref 38–126)
Anion gap: 9 (ref 5–15)
BUN: 13 mg/dL (ref 6–20)
CO2: 24 mmol/L (ref 22–32)
Calcium: 9.4 mg/dL (ref 8.9–10.3)
Chloride: 105 mmol/L (ref 98–111)
Creatinine, Ser: 0.88 mg/dL (ref 0.44–1.00)
GFR, Estimated: >60 mL/min (ref >60)
Glucose, Bld: 94 mg/dL (ref 70–99)
Potassium: 3.7 mmol/L (ref 3.5–5.1)
Sodium: 138 mmol/L (ref 135–145)
Total Bilirubin: 0.6 mg/dL (ref 0.3–1.2)
Total Protein: 7.8 g/dL (ref 6.5–8.1)

## 2020-05-14 LAB — CBC
HCT: 40.9 % (ref 36.0–46.0)
Hemoglobin: 12.6 g/dL (ref 12.0–15.0)
MCH: 28.4 pg (ref 26.0–34.0)
MCHC: 30.8 g/dL (ref 30.0–36.0)
MCV: 92.1 fL (ref 80.0–100.0)
Platelets: 268 10*3/uL (ref 150–400)
RBC: 4.44 MIL/uL (ref 3.87–5.11)
RDW: 13.4 % (ref 11.5–15.5)
WBC: 10.4 10*3/uL (ref 4.0–10.5)
nRBC: 0 % (ref 0.0–0.2)

## 2020-05-14 LAB — BRAIN NATRIURETIC PEPTIDE: B Natriuretic Peptide: 32 pg/mL (ref 0.0–100.0)

## 2020-05-14 MED ORDER — FUROSEMIDE 20 MG PO TABS: 20.0000 mg | ORAL_TABLET | Freq: Every day | ORAL | 0 refills | Status: AC

## 2020-05-14 NOTE — ED Provider Notes (Signed)
[facility]    CSN: [id number] Arrival date & time: 05/14/20  1321      History   Chief Complaint No chief complaint on file.   HPI Taylor Franklin is a 55 y.o. female.   HPI Patient presents today with chronic gradually worsening bilateral lower leg and ankle swelling.  She also endorses symptoms of shortness of breath with exertional activities and has had some unintentional weight gain and swelling involving bilateral hands.  In review of EMR patient has a history of hypertension but when discussed with her she is unaware of this diagnosis and has not taken any medication for hypertension or been seen by medical provider since 2017.  She endorses a family history of hypertension.  She denies any chest pain, HA, or dizziness.  But grew concerned as the swelling in her legs and feet appears to be increasing and occurring more frequently.  She is also noted some discoloration at the base of her ankles.  She denies any injury to her ankles or her legs.  Endorses constant tingling involving both of her legs. Past Medical History:  Diagnosis Date  . Hypertension     There are no problems to display for this patient.   Past Surgical History:  Procedure Laterality Date  . ABDOMINAL HYSTERECTOMY    . CESAREAN SECTION     x3    OB History   No obstetric history on file.      Home Medications    Prior to Admission medications   Medication Sig Start Date End Date Taking? Authorizing Provider  doxycycline (VIBRAMYCIN) 100 MG capsule Take 1 capsule (100 mg total) by mouth 2 (two) times daily. 09/19/19   [place], [place], PA-C  tiZANidine (ZANAFLEX) 4 MG tablet Take 4 mg by mouth every 6 (six) hours as needed for muscle spasms.    [provider]    Family History Family History  Family history unknown: Yes    Social History Social History   Tobacco Use  . Smoking status: Never Smoker  . Smokeless tobacco: Never Used  Substance Use Topics  .  Alcohol use: No  . Drug use: No     Allergies   Lisinopril   Review of Systems Review of Systems Pertinent negatives listed in HPI  Physical Exam Triage Vital Signs ED Triage Vitals  Enc Vitals Group     BP 05/14/20 1335 (!) 154/78     Pulse --      Resp 05/14/20 1335 18     Temp 05/14/20 1335 98.7 F (37.1 C)     Temp Source 05/14/20 1335 Oral     SpO2 05/14/20 1335 98 %     Weight --      Height --      Head Circumference --      Peak Flow --      Pain Score 05/14/20 1336 8     Pain Loc --      Pain Edu? --      Excl. in [place]? --    No data found.  Updated Vital Signs BP (!) 154/78 (BP Location: Right Arm)   Temp 98.7 F (37.1 C) (Oral)   Resp 18   SpO2 98%   Visual Acuity Right Eye Distance:   Left Eye Distance:   Bilateral Distance:    Right Eye Near:   Left Eye Near:    Bilateral Near:     Physical Exam Constitutional:      Appearance: She  is obese.  Cardiovascular:     Heart sounds: Murmur heard.    Pulmonary:     Comments: CTABL Musculoskeletal:     Right lower leg: 4+ Edema present.     Left lower leg: 4+ Edema present.  Neurological:     Mental Status: She is alert.     GCS: GCS eye subscore is 4. GCS verbal subscore is 5. GCS motor subscore is 6.     Cranial Nerves: Cranial nerves are intact.  Psychiatric:        Behavior: Behavior is cooperative.      UC Treatments / Results  Labs (all labs ordered are listed, but only abnormal results are displayed) Labs Reviewed - No data to display  EKG   Radiology No results found.  Procedures Procedures (including critical care time)  Medications Ordered in UC Medications - No data to display  Initial Impression / Assessment and Plan / UC Course  I have reviewed the triage vital signs and the nursing notes.  Pertinent labs & imaging results that were available during my care of the patient were reviewed by me and considered in my medical decision making (see chart for  details).    Given patient's symptoms and no recent preventative care treatment nor has she had any recent management of her hypertension concern for possible cardiovascular etiology as a source of symptoms.  Region over the last patient has had an elevated creatinine during a prior hospital admission however that was attributed to possible hypokalemia.  However given the limitations of the  urgent care patient warrants a higher level of care and is being deferred to  [place]. Patient is stable for independent transport.  Final Clinical Impressions(s) / UC Diagnoses   Final diagnoses:  Bilateral swelling of feet and ankles  Shortness of breath on exertion  Essential hypertension, untreated     Discharge Instructions     Go directly to [facility] for evaluation for lower leg swelling and SOB with exertion. Further work-up is needed to rule out any acute problems with her your heart, kidneys, or blood clots.     ED Prescriptions    None     PDMP not reviewed this encounter.   Scot Jun, FNP 05/14/20 1414

## 2020-05-14 NOTE — Discharge Instructions (Addendum)
Go directly to [facility] for evaluation for lower leg swelling and SOB with exertion. Further work-up is needed to rule out any acute problems with her your heart, kidneys, or blood clots.

## 2020-05-14 NOTE — ED Provider Notes (Addendum)
[facility] EMERGENCY DEPARTMENT Provider Note   CSN: [id number] Arrival date & time: 05/14/20  1418     History Chief Complaint  Patient presents with  . Leg Swelling    Taylor Franklin is a 55 y.o. female.  HPI   This patient is a 55 year old female, she has a history of hypertension at least based on what is recorded in the medical record.  She reports that she takes no medications, she has not seen a doctor in approximately 5 years, her last surgical procedure was a hysterectomy and that was performed more than 2 years ago.  She is actively employed working in 2 different jobs and staying active, despite this she has noted that she is having some shortness of breath with exertion and has been suffering from bilateral leg swelling especially at the ankles and feeling that both of her hands are swelling as well.  She has no cough no fever no chest pain and essentially has not pursued medical evaluation for any of these complaints prior to going to the urgent care today.  When she arrived to the urgent care the constellation of symptoms were concerning enough that she was redirected to the emergency department for further evaluation.  The patient is actually symptom-free at this time except for the swelling in her ankles and she states that is eczema going on for a couple of months.  She does notice that she has becoming more obese and gaining weight  Past Medical History:  Diagnosis Date  . Hypertension     There are no problems to display for this patient.   Past Surgical History:  Procedure Laterality Date  . ABDOMINAL HYSTERECTOMY    . CESAREAN SECTION     x3     OB History   No obstetric history on file.     Family History  Family history unknown: Yes    Social History   Tobacco Use  . Smoking status: Never Smoker  . Smokeless tobacco: Never Used  Vaping Use  . Vaping Use: Never used  Substance Use Topics  . Alcohol use: No  . Drug use: No    Home  Medications Prior to Admission medications   Medication Sig Start Date End Date Taking? Authorizing Provider  acetaminophen (TYLENOL) 650 MG CR tablet Take 650 mg by mouth every 8 (eight) hours as needed for pain.   Yes [provider]  furosemide (LASIX) 20 MG tablet Take 1 tablet (20 mg total) by mouth daily for 7 days. 05/14/20 05/21/20 Yes Noemi Chapel, MD    Allergies    Lisinopril  Review of Systems   Review of Systems  All other systems reviewed and are negative.   Physical Exam Updated Vital Signs BP 133/81 (BP Location: Right Arm)   Pulse 73   Temp 98.2 F (36.8 C) (Oral)   Resp 18   Ht 1.702 m (5\' 7" )   Wt 94.3 kg   SpO2 100%   BMI 32.58 kg/m   Physical Exam Vitals and nursing note reviewed.  Constitutional:      General: She is not in acute distress.    Appearance: She is well-developed.  HENT:     Head: Normocephalic and atraumatic.     Mouth/Throat:     Pharynx: No oropharyngeal exudate.  Eyes:     General: No scleral icterus.       Right eye: No discharge.        Left eye: No discharge.  Conjunctiva/sclera: Conjunctivae normal.     Pupils: Pupils are equal, round, and reactive to light.  Neck:     Thyroid: No thyromegaly.     Vascular: No JVD.  Cardiovascular:     Rate and Rhythm: Normal rate and regular rhythm.     Heart sounds: Normal heart sounds. No murmur heard. No friction rub. No gallop.   Pulmonary:     Effort: Pulmonary effort is normal. No respiratory distress.     Breath sounds: Normal breath sounds. No wheezing or rales.  Abdominal:     General: Bowel sounds are normal. There is no distension.     Palpations: Abdomen is soft. There is no mass.     Tenderness: There is no abdominal tenderness.  Musculoskeletal:        General: No tenderness. Normal range of motion.     Cervical back: Normal range of motion and neck supple.     Right lower leg: Edema present.     Left lower leg: Edema present.     Comments: There is  scant bilateral lower extremity edema, the patient is generally obese and has a high body mass index, the ankles are bilaterally swollen but there is no significant pitting edema, it is only scant  Lymphadenopathy:     Cervical: No cervical adenopathy.  Skin:    General: Skin is warm and dry.     Findings: No erythema or rash.  Neurological:     Mental Status: She is alert.     Coordination: Coordination normal.  Psychiatric:        Behavior: Behavior normal.     ED Results / Procedures / Treatments   Labs (all labs ordered are listed, but only abnormal results are displayed) Labs Reviewed  CBC  COMPREHENSIVE METABOLIC PANEL  BRAIN NATRIURETIC PEPTIDE    EKG EKG Interpretation  Date/Time:  Sunday May 14 2020 17:36:04 EDT Ventricular Rate:  62 PR Interval:    QRS Duration: 92 QT Interval:  423 QTC Calculation: 430 R Axis:   62 Text Interpretation: Sinus rhythm Nonspecific T abnormalities, anterior leads Baseline wander in lead(s) V1 Confirmed by Noemi Chapel [phone]) on 05/14/2020 5:40:14 PM   Radiology No results found.  Procedures Procedures   Medications Ordered in ED Medications - No data to display  ED Course  I have reviewed the triage vital signs and the nursing notes.  Pertinent labs & imaging results that were available during my care of the patient were reviewed by me and considered in my medical decision making (see chart for details).    MDM Rules/Calculators/A&P                          The patient will likely need to have a thyroid work-up with your family doctor, we will make sure that she does not have any signs of heart failure, liver failure or renal failure each of which could cause swelling and edema.  Her shortness of breath with exertion will be evaluated with an EKG though she does not have any active symptoms at this time.  All of these things may need follow-up if no etiology is found and the patient is willing to do that.  She is able to  tell me the name of her doctor in [place].  Given months of shortness of breath with no appreciable with significant edema or asymmetry of the legs, no hypoxia and no tachycardia pulmonary embolism is extremely unlikely  and no DVT study would need to be done at this time.  Labs normal, Pt well appearing  Vitals:   05/14/20 1426 05/14/20 1427  BP: 133/81   Pulse: 73   Resp: 18   Temp: 98.2 F (36.8 C)   TempSrc: Oral   SpO2: 100%   Weight:  94.3 kg  Height:  1.702 m (5\' 7" )   Stable for d/c on 1 week of lasix and f/u with PCP  Final Clinical Impression(s) / ED Diagnoses Final diagnoses:  Leg swelling    Rx / DC Orders ED Discharge Orders         Ordered    furosemide (LASIX) 20 MG tablet  Daily        05/14/20 1734           Noemi Chapel, MD 05/14/20 1735    Noemi Chapel, MD 05/14/20 1740

## 2020-05-14 NOTE — ED Triage Notes (Signed)
Pt to er, pt states that she is here for bilateral lower extremity edema, states that she has been having the swelling off and on for the past several months, states that she also has some shortness of breath with exertion, pt talking in full sentences at this time, resps even and unlabored. Pt denies chest pain.

## 2020-05-14 NOTE — Discharge Instructions (Addendum)
Return to the ER for worsening symptoms All of your testing was normal  Take Lasix (furosemide) once a day for the next 7 days  This will help with some of the swelling  See your doctor in 1 week for recheck

## 2020-05-14 NOTE — ED Triage Notes (Signed)
Bilateral ankle swelling.  States legs are tingling.
